# Patient Record
Sex: Female | Born: 1980
Health system: Southern US, Community
[De-identification: ages and names within clinical notes are randomized; demographics above are authoritative.]

## PROBLEM LIST (undated history)

## (undated) DIAGNOSIS — F32A Depression, unspecified: Secondary | ICD-10-CM

## (undated) DIAGNOSIS — K219 Gastro-esophageal reflux disease without esophagitis: Secondary | ICD-10-CM

## (undated) DIAGNOSIS — F419 Anxiety disorder, unspecified: Secondary | ICD-10-CM

## (undated) HISTORY — PX: EYE SURGERY: SHX253

## (undated) HISTORY — DX: Depression, unspecified: F32.A

---

## 2011-08-21 DIAGNOSIS — E559 Vitamin D deficiency, unspecified: Secondary | ICD-10-CM | POA: Insufficient documentation

## 2012-03-05 DIAGNOSIS — E221 Hyperprolactinemia: Secondary | ICD-10-CM | POA: Insufficient documentation

## 2013-09-20 DIAGNOSIS — O1490 Unspecified pre-eclampsia, unspecified trimester: Secondary | ICD-10-CM | POA: Insufficient documentation

## 2016-01-29 DIAGNOSIS — Z975 Presence of (intrauterine) contraceptive device: Secondary | ICD-10-CM | POA: Insufficient documentation

## 2019-12-13 DIAGNOSIS — Z01419 Encounter for gynecological examination (general) (routine) without abnormal findings: Secondary | ICD-10-CM | POA: Diagnosis not present

## 2019-12-13 DIAGNOSIS — Z6828 Body mass index (BMI) 28.0-28.9, adult: Secondary | ICD-10-CM | POA: Diagnosis not present

## 2020-02-06 LAB — HM PAP SMEAR: HM Pap smear: NORMAL

## 2020-03-02 DIAGNOSIS — Z30433 Encounter for removal and reinsertion of intrauterine contraceptive device: Secondary | ICD-10-CM | POA: Diagnosis not present

## 2020-04-10 DIAGNOSIS — Z30431 Encounter for routine checking of intrauterine contraceptive device: Secondary | ICD-10-CM | POA: Diagnosis not present

## 2020-12-20 DIAGNOSIS — Z124 Encounter for screening for malignant neoplasm of cervix: Secondary | ICD-10-CM | POA: Diagnosis not present

## 2020-12-20 DIAGNOSIS — Z6829 Body mass index (BMI) 29.0-29.9, adult: Secondary | ICD-10-CM | POA: Diagnosis not present

## 2020-12-20 DIAGNOSIS — Z01419 Encounter for gynecological examination (general) (routine) without abnormal findings: Secondary | ICD-10-CM | POA: Diagnosis not present

## 2021-01-03 DIAGNOSIS — Z20822 Contact with and (suspected) exposure to covid-19: Secondary | ICD-10-CM | POA: Diagnosis not present

## 2021-01-17 ENCOUNTER — Emergency Department
Admission: RE | Admit: 2021-01-17 | Discharge: 2021-01-17 | Disposition: A | Discharge status: Home / Self Care | Payer: Self-pay | Source: Ambulatory Visit | Attending: Family Medicine | Admitting: Family Medicine

## 2021-01-17 ENCOUNTER — Emergency Department (INDEPENDENT_AMBULATORY_CARE_PROVIDER_SITE_OTHER): Payer: BC Managed Care – PPO

## 2021-01-17 ENCOUNTER — Other Ambulatory Visit: Payer: Self-pay

## 2021-01-17 VITALS — BP 118/82 | HR 81 | Temp 98.8°F | Resp 16 | Ht 64.0 in | Wt 173.0 lb

## 2021-01-17 DIAGNOSIS — R079 Chest pain, unspecified: Secondary | ICD-10-CM | POA: Diagnosis not present

## 2021-01-17 DIAGNOSIS — R1013 Epigastric pain: Secondary | ICD-10-CM

## 2021-01-17 DIAGNOSIS — R141 Gas pain: Secondary | ICD-10-CM

## 2021-01-17 HISTORY — DX: Gastro-esophageal reflux disease without esophagitis: K21.9

## 2021-01-17 HISTORY — DX: Anxiety disorder, unspecified: F41.9

## 2021-01-17 MED ORDER — OMEPRAZOLE 20 MG PO CPDR: 20.0000 mg | DELAYED_RELEASE_CAPSULE | Freq: Every day | ORAL | 0 refills | Status: AC

## 2021-01-17 MED ORDER — ALUM & MAG HYDROXIDE-SIMETH 200-200-20 MG/5ML PO SUSP
30.0000 mL | Freq: Once | ORAL | Status: AC
  Administered 2021-01-17: 30 mL via ORAL

## 2021-01-17 MED ORDER — LIDOCAINE VISCOUS HCL 2 % MT SOLN
15.0000 mL | Freq: Once | OROMUCOSAL | Status: AC
  Administered 2021-01-17: 15 mL via OROMUCOSAL

## 2021-01-17 NOTE — Discharge Instructions (Addendum)
Take the Prilosec once a day for 2 weeks.  After that she may take it as needed Consider Mylanta (with simethicone) instead of Tums when you have abdominal pain Consider getting some Gas-X to take Watch your diet.  Avoid highly spiced and fatty foods until you feel better Follow-up with your primary care doctor

## 2021-01-17 NOTE — ED Triage Notes (Addendum)
Patient c/o intermittent central CP x 3-4 days.  Described as tight and wraps around to her upper back. No other associated symptoms.  It started while on vacation. Reports she developed congestion then the CP started the next day. H/o heartburn and reflux. Usually tums alleviates CP, tums is not helping. H/o MI with her grandmother @ 78. She does not have a PCP.

## 2021-01-17 NOTE — ED Provider Notes (Signed)
[facility]    CSN: [id number] Arrival date & time: 01/17/21  0948      History   Chief Complaint Chief Complaint  Patient presents with   Chest Pain    HPI Brenda Jackson is a 40 y.o. female.   HPI Patient is a healthy 40 year old woman.  Only medication is multiple vitamins.  She walks daily up to 8 miles a day.  She does not have any known health conditions. She was seen in the emergency room in January 2020 for precordial pain.  Her work-up included blood work, troponins, chest x-ray, and EKG.  The EKG did have an irregular rhythm and she was advised to see cardiology.  This message somehow was not forwarded to the patient and she has not seen anybody since that time for follow-up. Today she is here for chest pain.  It comes intermittently.  It is not related to exercise.  She has continued to walk without difficulty.  It started while she was on vacation.  They went on a [place] cruise.  She states that the pain is in her central chest.  Upper abdomen.  Radiates around to her back.  She has been taking Tums with no improvement.  No nausea or change in appetite. Patient has no cardiac risk factors, she did have 1 grandmother who had a heart attack at age 9.  Patient has no hypertension, diabetes, hyperlipidemia, cigarette smoking or known risk. Past Medical History:  Diagnosis Date   Acid reflux    Anxiety     There are no problems to display for this patient.   Past Surgical History:  Procedure Laterality Date   EYE SURGERY      OB History   No obstetric history on file.      Home Medications    Prior to Admission medications   Medication Sig Start Date End Date Taking? Authorizing Provider  Multiple Vitamin (MULTIVITAMIN) tablet Take 1 tablet by mouth daily.   Yes [provider]  omeprazole (PRILOSEC) 20 MG capsule Take 1 capsule (20 mg total) by mouth daily. 01/17/21  Yes Eustace Moore, MD    Family History Family History   Problem Relation Age of Onset   Heart attack Maternal Grandmother     Social History Social History   Tobacco Use   Smoking status: Never   Smokeless tobacco: Never  Vaping Use   Vaping Use: Never used  Substance Use Topics   Alcohol use: Yes   Drug use: Never     Allergies   Patient has no known allergies.   Review of Systems Review of Systems See HPI  Physical Exam Triage Vital Signs ED Triage Vitals  Enc Vitals Group     BP 01/17/21 1015 118/82     Pulse Rate 01/17/21 1015 81     Resp 01/17/21 1015 16     Temp 01/17/21 1015 98.8 F (37.1 C)     Temp Source 01/17/21 1015 Oral     SpO2 01/17/21 1015 99 %     Weight 01/17/21 1021 173 lb (78.5 kg)     Height 01/17/21 1021 5\' 4"  (1.626 m)     Head Circumference --      Peak Flow --      Pain Score 01/17/21 1021 0     Pain Loc --      Pain Edu? --      Excl. in GC? --    No data found.  Updated Vital  Signs BP 118/82 (BP Location: Right Arm)   Pulse 81   Temp 98.8 F (37.1 C) (Oral)   Resp 16   Ht 5\' 4"  (1.626 m)   Wt 78.5 kg   SpO2 99%   BMI 29.70 kg/m       Physical Exam Constitutional:      General: She is not in acute distress.    Appearance: Normal appearance. She is well-developed and normal weight.  HENT:     Head: Normocephalic and atraumatic.     Right Ear: Tympanic membrane and ear canal normal.     Left Ear: Tympanic membrane and ear canal normal.     Nose: Nose normal. No congestion.     Mouth/Throat:     Mouth: Mucous membranes are moist.     Pharynx: No posterior oropharyngeal erythema.  Eyes:     Conjunctiva/sclera: Conjunctivae normal.     Pupils: Pupils are equal, round, and reactive to light.  Cardiovascular:     Rate and Rhythm: Normal rate and regular rhythm.     Heart sounds: Normal heart sounds.  Pulmonary:     Effort: Pulmonary effort is normal. No respiratory distress.     Breath sounds: Normal breath sounds.  Abdominal:     General: Abdomen is flat. Bowel  sounds are normal. There is no distension.     Palpations: Abdomen is soft.     Tenderness: There is abdominal tenderness.     Comments: Midepigastric tenderness to deep palpation.  No organomegaly.  Musculoskeletal:        General: Normal range of motion.     Cervical back: Normal range of motion and neck supple.     Right lower leg: No edema.     Left lower leg: No edema.  Skin:    General: Skin is warm and dry.  Neurological:     General: No focal deficit present.     Mental Status: She is alert.  Psychiatric:        Mood and Affect: Mood normal.        Behavior: Behavior normal.     UC Treatments / Results  Labs (all labs ordered are listed, but only abnormal results are displayed) Labs Reviewed - No data to display  EKG-rate is 72.  Normal sinus rhythm.  No ST or T wave changes. Prior EKG is not available for review.   Radiology DG Chest 2 View  Result Date: 01/17/2021 CLINICAL DATA:  Chest pain EXAM: CHEST - 2 VIEW COMPARISON:  None. FINDINGS: The heart size and mediastinal contours are within normal limits. Both lungs are clear. The visualized skeletal structures are unremarkable. IMPRESSION: Normal study. Electronically Signed   By: 01/19/2021 M.D.   On: 01/17/2021 11:19    Procedures Procedures (including critical care time)  Medications Ordered in UC Medications  alum & mag hydroxide-simeth (MAALOX/MYLANTA) 200-200-20 MG/5ML suspension 30 mL (30 mLs Oral Given 01/17/21 1157)  lidocaine (XYLOCAINE) 2 % viscous mouth solution 15 mL (15 mLs Mouth/Throat Given 01/17/21 1157)    Initial Impression / Assessment and Plan / UC Course  I have reviewed the triage vital signs and the nursing notes.  Pertinent labs & imaging results that were available during my care of the patient were reviewed by me and considered in my medical decision making (see chart for details).     PatientDeveloped chest pain during the office visit.  It was similar to her chest pain  complaint.  She was given a  GI cocktail and had almost immediate relief of her symptoms. EKG and chest x-ray are normal.  No evidence of cardiac disease. Final Clinical Impressions(s) / UC Diagnoses   Final diagnoses:  Abdominal pain, epigastric  Abdominal gas pain     Discharge Instructions      Take the Prilosec once a day for 2 weeks.  After that she may take it as needed Consider Mylanta (with simethicone) instead of Tums when you have abdominal pain Consider getting some Gas-X to take Watch your diet.  Avoid highly spiced and fatty foods until you feel better Follow-up with your primary care doctor     ED Prescriptions     Medication Sig Dispense Auth. Provider   omeprazole (PRILOSEC) 20 MG capsule Take 1 capsule (20 mg total) by mouth daily. 30 capsule Eustace Moore, MD      PDMP not reviewed this encounter.   Eustace Moore, MD 01/17/21 1300

## 2021-01-18 DIAGNOSIS — Z30431 Encounter for routine checking of intrauterine contraceptive device: Secondary | ICD-10-CM | POA: Diagnosis not present

## 2021-01-25 DIAGNOSIS — Z1231 Encounter for screening mammogram for malignant neoplasm of breast: Secondary | ICD-10-CM | POA: Diagnosis not present

## 2021-03-20 DIAGNOSIS — L814 Other melanin hyperpigmentation: Secondary | ICD-10-CM | POA: Diagnosis not present

## 2021-03-20 DIAGNOSIS — L700 Acne vulgaris: Secondary | ICD-10-CM | POA: Diagnosis not present

## 2021-03-26 ENCOUNTER — Other Ambulatory Visit: Payer: Self-pay

## 2021-03-26 ENCOUNTER — Ambulatory Visit (INDEPENDENT_AMBULATORY_CARE_PROVIDER_SITE_OTHER): Payer: BC Managed Care – PPO | Admitting: Medical-Surgical

## 2021-03-26 ENCOUNTER — Encounter: Payer: Self-pay | Admitting: Medical-Surgical

## 2021-03-26 VITALS — BP 124/85 | HR 68 | Resp 20 | Ht 64.0 in | Wt 180.0 lb

## 2021-03-26 DIAGNOSIS — E661 Drug-induced obesity: Secondary | ICD-10-CM

## 2021-03-26 DIAGNOSIS — Z6830 Body mass index (BMI) 30.0-30.9, adult: Secondary | ICD-10-CM

## 2021-03-26 DIAGNOSIS — K219 Gastro-esophageal reflux disease without esophagitis: Secondary | ICD-10-CM

## 2021-03-26 DIAGNOSIS — Z131 Encounter for screening for diabetes mellitus: Secondary | ICD-10-CM

## 2021-03-26 DIAGNOSIS — Z0000 Encounter for general adult medical examination without abnormal findings: Secondary | ICD-10-CM

## 2021-03-26 DIAGNOSIS — Z1329 Encounter for screening for other suspected endocrine disorder: Secondary | ICD-10-CM | POA: Diagnosis not present

## 2021-03-26 DIAGNOSIS — E66811 Obesity, class 1: Secondary | ICD-10-CM

## 2021-03-26 DIAGNOSIS — F419 Anxiety disorder, unspecified: Secondary | ICD-10-CM | POA: Diagnosis not present

## 2021-03-26 DIAGNOSIS — Z7689 Persons encountering health services in other specified circumstances: Secondary | ICD-10-CM

## 2021-03-26 NOTE — Progress Notes (Signed)
New Patient Office Visit  Subjective:  Patient ID: Brenda Jackson, female    DOB: 1980/11/11  Age: 40 y.o. MRN: [id number]  CC:  Chief Complaint  Patient presents with   Establish Care    HPI [name] presents to establish care.  Reflux-recently seen in the urgent care for epigastric/chest pain.  Was given GI cocktail which provided almost immediate relief.  Started on omeprazole daily and daily as needed.  Notes that the medication has worked well and she rarely takes at home.  Instead, she uses Gas-X if she is eating foods that she knows causes excessive gassiness.  She also uses Mylanta as needed which is more helpful than Pepto-Bismol.  Dentist: Has yearly exams, no concerns today Eye exam-had LASEK in March and has been undergoing testing for that, no concerns today Exercise-walks 3 to 4 days/week for approximately 2 to 5 miles, no weightbearing exercises Diet-could be better, tends to be a stress eater Pap smear completed last summer, normal  Past Medical History:  Diagnosis Date   Acid reflux    Anxiety    Depression 2007    Past Surgical History:  Procedure Laterality Date   EYE SURGERY      Family History  Problem Relation Age of Onset   Heart attack Maternal Grandmother    Arthritis Maternal Grandmother    Heart disease Maternal Grandmother    Anxiety disorder Mother    Depression Mother    Varicose Veins Maternal Aunt     Social History   Socioeconomic History   Marital status: Married    Spouse name: Not on file   Number of children: Not on file   Years of education: Not on file   Highest education level: Not on file  Occupational History   Not on file  Tobacco Use   Smoking status: Never   Smokeless tobacco: Never  Vaping Use   Vaping Use: Never used  Substance and Sexual Activity   Alcohol use: Yes    Comment: Drink socially, maybe ince per month   Drug use: Not Currently   Sexual activity: Yes    Birth control/protection: I.U.D.   Other Topics Concern   Not on file  Social History Narrative   Not on file   Social Determinants of Health   Financial Resource Strain: Not on file  Food Insecurity: Not on file  Transportation Needs: Not on file  Physical Activity: Not on file  Stress: Not on file  Social Connections: Not on file  Intimate Partner Violence: Not on file    ROS Review of Systems  Constitutional:  Negative for chills, fatigue, fever and unexpected weight change.  HENT:  Negative for congestion, rhinorrhea, sinus pressure and sore throat.   Respiratory:  Negative for cough, chest tightness and shortness of breath.   Cardiovascular:  Positive for chest pain (reflux). Negative for palpitations and leg swelling.  Gastrointestinal:  Positive for abdominal pain (reflux). Negative for constipation, diarrhea, nausea and vomiting.  Endocrine: Positive for heat intolerance. Negative for cold intolerance.  Genitourinary:  Negative for dysuria, frequency, urgency, vaginal bleeding and vaginal discharge.  Musculoskeletal:  Positive for arthralgias, back pain and myalgias.  Skin:  Negative for rash and wound.  Neurological:  Positive for headaches. Negative for dizziness and light-headedness.  Hematological:  Does not bruise/bleed easily.  Psychiatric/Behavioral:  Positive for dysphoric mood. Negative for self-injury, sleep disturbance and suicidal ideas. The patient is nervous/anxious.    Objective:   Today's Vitals: BP 124/85 (  BP Location: Left Arm, Patient Position: Sitting, Cuff Size: Normal)   Pulse 68   Resp 20   Ht 5\' 4"  (1.626 m)   Wt 180 lb (81.6 kg)   SpO2 99%   BMI 30.90 kg/m   Physical Exam Constitutional:      General: She is not in acute distress.    Appearance: Normal appearance. She is not ill-appearing.  HENT:     Head: Normocephalic and atraumatic.     Right Ear: Tympanic membrane normal.     Left Ear: Tympanic membrane normal.  Eyes:     General: No scleral icterus.        Right eye: No discharge.        Left eye: No discharge.     Extraocular Movements: Extraocular movements intact.     Conjunctiva/sclera: Conjunctivae normal.     Pupils: Pupils are equal, round, and reactive to light.  Neck:     Thyroid: No thyromegaly.     Vascular: No carotid bruit or JVD.     Trachea: Trachea normal.  Cardiovascular:     Rate and Rhythm: Normal rate and regular rhythm.     Pulses: Normal pulses.     Heart sounds: Normal heart sounds. No murmur heard.   No friction rub. No gallop.  Pulmonary:     Effort: Pulmonary effort is normal. No respiratory distress.     Breath sounds: Normal breath sounds. No wheezing.  Abdominal:     General: Bowel sounds are normal. There is no distension.     Palpations: Abdomen is soft.     Tenderness: There is no abdominal tenderness. There is no guarding.  Musculoskeletal:        General: Normal range of motion.     Cervical back: Normal range of motion and neck supple.  Skin:    General: Skin is warm and dry.  Neurological:     Mental Status: She is alert and oriented to person, place, and time.     Cranial Nerves: No cranial nerve deficit.  Psychiatric:        Mood and Affect: Mood normal.        Behavior: Behavior normal.        Thought Content: Thought content normal.        Judgment: Judgment normal.    Assessment & Plan:   1. Encounter to establish care Reviewed available information and discussed care concerns with patient.   2. Gastroesophageal reflux disease without esophagitis Continue omeprazole, Mylanta, Gas-X as needed.  3. Anxiety Not currently taking medication.  Symptoms managed with lifestyle modifications.  4. Class 1 drug-induced obesity with serious comorbidity and body mass index (BMI) of 30.0 to 30.9 in adult Checking TSH and hemoglobin A1c.  Recommend increasing intentional exercise and incorporating body weight exercises.  Also recommend monitoring dietary intake and making sure to pay attention  to getting enough protein. - TSH - Hemoglobin A1c  5. Annual physical exam Checking CBC with differential, CMP, lipid panel today. - CBC with Differential/Platelet - COMPLETE METABOLIC PANEL WITH GFR - Lipid panel  6. Thyroid disorder screen Checking TSH. - TSH  7. Diabetes mellitus screening Checking hemoglobin A1c. - Hemoglobin A1c   Outpatient Encounter Medications as of 03/26/2021  Medication Sig   Clindamycin-Benzoyl Per, Refr, gel Apply topically daily.   doxycycline (VIBRA-TABS) 100 MG tablet Take 100 mg by mouth daily.   ibuprofen (ADVIL) 200 MG tablet Take by mouth.   Multiple Vitamin (MULTIVITAMIN) tablet Take 1  tablet by mouth daily.   omeprazole (PRILOSEC) 20 MG capsule Take 1 capsule (20 mg total) by mouth daily.   No facility-administered encounter medications on file as of 03/26/2021.    Follow-up: Return in about 1 year (around 03/26/2022) for annual physical exam or sooner if needed.   Thayer Ohm, DNP, APRN, FNP-BC [facility]

## 2021-03-26 NOTE — Patient Instructions (Signed)
Preventive Care 40-41 Years Old, Female Preventive care refers to lifestyle choices and visits with your health care provider that can promote health and wellness. This includes: A yearly physical exam. This is also called an annual wellness visit. Regular dental and eye exams. Immunizations. Screening for certain conditions. Healthy lifestyle choices, such as: Eating a healthy diet. Getting regular exercise. Not using drugs or products that contain nicotine and tobacco. Limiting alcohol use. What can I expect for my preventive care visit? Physical exam Your health care provider will check your: Height and weight. These may be used to calculate your BMI (body mass index). BMI is a measurement that tells if you are at a healthy weight. Heart rate and blood pressure. Body temperature. Skin for abnormal spots. Counseling Your health care provider may ask you questions about your: Past medical problems. Family's medical history. Alcohol, tobacco, and drug use. Emotional well-being. Home life and relationship well-being. Sexual activity. Diet, exercise, and sleep habits. Work and work environment. Access to firearms. Method of birth control. Menstrual cycle. Pregnancy history. What immunizations do I need? Vaccines are usually given at various ages, according to a schedule. Your health care provider will recommend vaccines for you based on your age, medical history, and lifestyle or other factors, such as travel or where you work. What tests do I need? Blood tests Lipid and cholesterol levels. These may be checked every 5 years, or more often if you are over 50 years old. Hepatitis C test. Hepatitis B test. Screening Lung cancer screening. You may have this screening every year starting at age 55 if you have a 30-pack-year history of smoking and currently smoke or have quit within the past 15 years. Colorectal cancer screening. All adults should have this screening starting at  age 50 and continuing until age 75. Your health care provider may recommend screening at age 45 if you are at increased risk. You will have tests every 1-10 years, depending on your results and the type of screening test. Diabetes screening. This is done by checking your blood sugar (glucose) after you have not eaten for a while (fasting). You may have this done every 1-3 years. Mammogram. This may be done every 1-2 years. Talk with your health care provider about when you should start having regular mammograms. This may depend on whether you have a family history of breast cancer. BRCA-related cancer screening. This may be done if you have a family history of breast, ovarian, tubal, or peritoneal cancers. Pelvic exam and Pap test. This may be done every 3 years starting at age 21. Starting at age 30, this may be done every 5 years if you have a Pap test in combination with an HPV test. Other tests STD (sexually transmitted disease) testing, if you are at risk. Bone density scan. This is done to screen for osteoporosis. You may have this scan if you are at high risk for osteoporosis. Talk with your health care provider about your test results, treatment options, and if necessary, the need for more tests. Follow these instructions at home: Eating and drinking  Eat a diet that includes fresh fruits and vegetables, whole grains, lean protein, and low-fat dairy products. Take vitamin and mineral supplements as recommended by your health care provider. Do not drink alcohol if: Your health care provider tells you not to drink. You are pregnant, may be pregnant, or are planning to become pregnant. If you drink alcohol: Limit how much you have to 0-1 drink a day. Be   aware of how much alcohol is in your drink. In the [place] one drink equals one 12 oz bottle of beer (355 mL), one 5 oz glass of wine (148 mL), or one 1 oz glass of hard liquor (44 mL). Lifestyle Take daily care of your teeth and  gums. Brush your teeth every morning and night with fluoride toothpaste. Floss one time each day. Stay active. Exercise for at least 30 minutes 5 or more days each week. Do not use any products that contain nicotine or tobacco, such as cigarettes, e-cigarettes, and chewing tobacco. If you need help quitting, ask your health care provider. Do not use drugs. If you are sexually active, practice safe sex. Use a condom or other form of protection to prevent STIs (sexually transmitted infections). If you do not wish to become pregnant, use a form of birth control. If you plan to become pregnant, see your health care provider for a prepregnancy visit. If told by your health care provider, take low-dose aspirin daily starting at age 63. Find healthy ways to cope with stress, such as: Meditation, yoga, or listening to music. Journaling. Talking to a trusted person. Spending time with friends and family. Safety Always wear your seat belt while driving or riding in a vehicle. Do not drive: If you have been drinking alcohol. Do not ride with someone who has been drinking. When you are tired or distracted. While texting. Wear a helmet and other protective equipment during sports activities. If you have firearms in your house, make sure you follow all gun safety procedures. What's next? Visit your health care provider once a year for an annual wellness visit. Ask your health care provider how often you should have your eyes and teeth checked. Stay up to date on all vaccines. This information is not intended to replace advice given to you by your health care provider. Make sure you discuss any questions you have with your health care provider. Document Revised: 09/01/2020 Document Reviewed: 03/05/2018 Elsevier Patient Education  2022 [employer].

## 2021-03-27 LAB — HEMOGLOBIN A1C
Hgb A1c MFr Bld: 5 % of total Hgb (ref <5.7)
Mean Plasma Glucose: 97 mg/dL
eAG (mmol/L): 5.4 mmol/L

## 2021-03-27 LAB — COMPLETE METABOLIC PANEL WITH GFR
AG Ratio: 2 (calc) (ref 1.0–2.5)
ALT: 11 U/L (ref 6–29)
AST: 16 U/L (ref 10–30)
Albumin: 4.8 g/dL (ref 3.6–5.1)
Alkaline phosphatase (APISO): 70 U/L (ref 31–125)
BUN: 14 mg/dL (ref 7–25)
CO2: 27 mmol/L (ref 20–32)
Calcium: 9.8 mg/dL (ref 8.6–10.2)
Chloride: 103 mmol/L (ref 98–110)
Creat: 0.71 mg/dL (ref 0.50–0.99)
Globulin: 2.4 g/dL (calc) (ref 1.9–3.7)
Glucose, Bld: 79 mg/dL (ref 65–99)
Potassium: 4.4 mmol/L (ref 3.5–5.3)
Sodium: 138 mmol/L (ref 135–146)
Total Bilirubin: 0.7 mg/dL (ref 0.2–1.2)
Total Protein: 7.2 g/dL (ref 6.1–8.1)
eGFR: 109 mL/min/{1.73_m2} (ref >=60)

## 2021-03-27 LAB — LIPID PANEL
Cholesterol: 198 mg/dL (ref <200)
HDL: 58 mg/dL (ref >=50)
LDL Cholesterol (Calc): 118 mg/dL (calc) — ABNORMAL HIGH
Non-HDL Cholesterol (Calc): 140 mg/dL (calc) — ABNORMAL HIGH (ref <130)
Total CHOL/HDL Ratio: 3.4 (calc) (ref <5.0)
Triglycerides: 114 mg/dL (ref <150)

## 2021-03-27 LAB — CBC WITH DIFFERENTIAL/PLATELET
Absolute Monocytes: 499 cells/uL (ref 200–950)
Basophils Absolute: 31 cells/uL (ref 0–200)
Basophils Relative: 0.4 %
Eosinophils Absolute: 133 cells/uL (ref 15–500)
Eosinophils Relative: 1.7 %
HCT: 43.3 % (ref 35.0–45.0)
Hemoglobin: 14.2 g/dL (ref 11.7–15.5)
Lymphs Abs: 2668 cells/uL (ref 850–3900)
MCH: 30.2 pg (ref 27.0–33.0)
MCHC: 32.8 g/dL (ref 32.0–36.0)
MCV: 92.1 fL (ref 80.0–100.0)
MPV: 11.2 fL (ref 7.5–12.5)
Monocytes Relative: 6.4 %
Neutro Abs: 4469 cells/uL (ref 1500–7800)
Neutrophils Relative %: 57.3 %
Platelets: 261 10*3/uL (ref 140–400)
RBC: 4.7 10*6/uL (ref 3.80–5.10)
RDW: 12.3 % (ref 11.0–15.0)
Total Lymphocyte: 34.2 %
WBC: 7.8 10*3/uL (ref 3.8–10.8)

## 2021-03-27 LAB — TSH: TSH: 1.01 mIU/L

## 2022-01-25 ENCOUNTER — Emergency Department
Admission: RE | Admit: 2022-01-25 | Discharge: 2022-01-25 | Disposition: A | Discharge status: Home / Self Care | Payer: BC Managed Care – PPO | Source: Ambulatory Visit | Attending: Nurse Practitioner | Admitting: Nurse Practitioner

## 2022-01-25 VITALS — BP 115/82 | HR 80 | Temp 99.2°F | Resp 14 | Ht 64.0 in | Wt 177.0 lb

## 2022-01-25 DIAGNOSIS — M79671 Pain in right foot: Secondary | ICD-10-CM | POA: Diagnosis not present

## 2022-01-25 DIAGNOSIS — M722 Plantar fascial fibromatosis: Secondary | ICD-10-CM | POA: Diagnosis not present

## 2022-01-25 MED ORDER — IBUPROFEN 800 MG PO TABS: 800.0000 mg | ORAL_TABLET | Freq: Three times a day (TID) | ORAL | 0 refills | Status: DC | PRN

## 2022-01-25 NOTE — ED Notes (Signed)
Pt walked to Dr Dionne Bucy office ([address]) to set up a follow up appointment

## 2022-01-25 NOTE — ED Triage Notes (Addendum)
Right foot pain x 2 years  Undiagnosed plantar fascitis Pain was unbearable the last few days  Pain worse at night Started wearing oofos yesterday  Constant pain 5 Wears compression sock  & does stretches OTC aleve, tylenol & ibuprofen

## 2022-01-25 NOTE — ED Provider Notes (Signed)
[facility]    CSN: [id number] Arrival date & time: 01/25/22  1751      History   Chief Complaint Chief Complaint  Patient presents with   Foot Pain    right    HPI Brenda Jackson is a 41 y.o. female.   Since with 2-year history of right heel pain, worsening over the past 2 months.  Reports the pain is severe after work when she takes her shoe off and also worse first thing in the morning.  She denies any recent fall, accident, injury, or trauma to the heel.  Denies radiation of pain up her leg.  Aggravating factors include weightbearing, walking/running.  She has tried ice, exercises, NSAIDs, rest without much relief.  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  She denies weakness with weightbearing, morning stiffness, swelling, redness, bruising, numbness or tingling in her toes, fevers, nausea/vomiting.    Past Medical History:  Diagnosis Date   Acid reflux    Anxiety    Depression 2007    Patient Active Problem List   Diagnosis Date Noted   IUD (intrauterine device) in place 01/29/2016   Pre-eclampsia 09/20/2013   Hyperprolactinemia (HCC) 03/05/2012   Vitamin D deficiency 08/21/2011    Past Surgical History:  Procedure Laterality Date   EYE SURGERY      OB History   No obstetric history on file.      Home Medications    Prior to Admission medications   Medication Sig Start Date End Date Taking? Authorizing Provider  ibuprofen (ADVIL) 800 MG tablet Take 1 tablet (800 mg total) by mouth every 8 (eight) hours as needed for moderate pain. Take with food to prevent GI upset 01/25/22  Yes Cathlean Marseilles A, NP  Clindamycin-Benzoyl Per, Refr, gel Apply topically daily. 03/20/21   [provider]  Multiple Vitamin (MULTIVITAMIN) tablet Take 1 tablet by mouth daily. Patient not taking: Reported on 01/25/2022    [provider]  omeprazole  (PRILOSEC) 20 MG capsule Take 1 capsule (20 mg total) by mouth daily. 01/17/21   Eustace Moore, MD    Family History Family History  Problem Relation Age of Onset   Anxiety disorder Mother    Depression Mother    Heart attack Maternal Grandmother    Arthritis Maternal Grandmother    Heart disease Maternal Grandmother    Varicose Veins Maternal Aunt     Social History Social History   Tobacco Use   Smoking status: Never   Smokeless tobacco: Never  Vaping Use   Vaping Use: Never used  Substance Use Topics   Alcohol use: Yes    Comment: Drink socially, maybe once per month   Drug use: Not Currently     Allergies   Patient has no known allergies.   Review of Systems Review of Systems Per HPI  Physical Exam Triage Vital Signs ED Triage Vitals  Enc Vitals Group     BP 01/25/22 1009 115/82     Pulse Rate 01/25/22 1009 80     Resp 01/25/22 1009 14     Temp 01/25/22 1009 99.2 F (37.3 C)     Temp Source 01/25/22 1009 Oral     SpO2 01/25/22 1009 99 %     Weight 01/25/22 1012 177 lb (80.3 kg)     Height 01/25/22 1012 5\' 4"  (1.626 m)  Head Circumference --      Peak Flow --      Pain Score 01/25/22 1011 5     Pain Loc --      Pain Edu? --      Excl. in GC? --    No data found.  Updated Vital Signs BP 115/82 (BP Location: Left Arm)   Pulse 80   Temp 99.2 F (37.3 C) (Oral)   Resp 14   Ht 5\' 4"  (1.626 m)   Wt 177 lb (80.3 kg)   SpO2 99%   BMI 30.38 kg/m   Visual Acuity Right Eye Distance:   Left Eye Distance:   Bilateral Distance:    Right Eye Near:   Left Eye Near:    Bilateral Near:     Physical Exam Vitals and nursing note reviewed.  Constitutional:      General: She is not in acute distress.    Appearance: Normal appearance. She is not toxic-appearing.  HENT:     Mouth/Throat:     Mouth: Mucous membranes are moist.     Pharynx: Oropharynx is clear.  Cardiovascular:     Pulses:          Dorsalis pedis pulses are 2+ on the right  side and 2+ on the left side.  Pulmonary:     Effort: Pulmonary effort is normal. No respiratory distress.  Musculoskeletal:       Feet:  Feet:     Comments: Inspection: No swelling, obvious deformity, or redness to bilateral lower extremities  Palpation: Tender to deep palpation in the area marked on the right heel; no obvious deformities palpated  ROM: Full ROM to ankle and flexibility of foot  Strength: 5/5 bilateral lower extremities Neurovascular: neurovascularly intact in left and right lower extremity  Skin:    General: Skin is warm and dry.     Capillary Refill: Capillary refill takes less than 2 seconds.     Coloration: Skin is not jaundiced or pale.     Findings: No erythema.  Neurological:     Mental Status: She is alert and oriented to person, place, and time.  Psychiatric:        Behavior: Behavior is cooperative.      UC Treatments / Results  Labs (all labs ordered are listed, but only abnormal results are displayed) Labs Reviewed - No data to display  EKG   Radiology No results found.  Procedures Procedures (including critical care time)  Medications Ordered in UC Medications - No data to display  Initial Impression / Assessment and Plan / UC Course  I have reviewed the triage vital signs and the nursing notes.  Pertinent labs & imaging results that were available during my care of the patient were reviewed by me and considered in my medical decision making (see chart for details).    Very pleasant, well-appearing 41 year old female presenting for right heel pain.  Highly suspicious for plantar fasciitis.  No red flags in history or on exam today.  Discussed treatment which she has already done much of on her own.  NSAID tablet sent to pharmacy.  Strongly encourage scheduling with a podiatrist, contact information given.  The patient was given the opportunity to ask questions.  All questions answered to their satisfaction.  The patient is in agreement  to this plan.   Final Clinical Impressions(s) / UC Diagnoses   Final diagnoses:  Pain of right heel  Plantar fasciitis of right foot     Discharge  Instructions      - Continue the great work with exercises, ice, and wearing supportive shoes - Can use Tylenol [phone] mg every 6 hours alternating with ibuprofen 800 mg every 8 hours as needed for pain - Follow up with a Podiatrist (contact information for [facility] office has been given, however Dr. Ralene Cork is in this building today) for further evaluation and management/treatment    ED Prescriptions     Medication Sig Dispense Auth. Provider   ibuprofen (ADVIL) 800 MG tablet Take 1 tablet (800 mg total) by mouth every 8 (eight) hours as needed for moderate pain. Take with food to prevent GI upset 21 tablet Valentino Nose, NP      PDMP not reviewed this encounter.   Valentino Nose, NP 01/25/22 1037

## 2022-01-25 NOTE — Discharge Instructions (Addendum)
-   Continue the great work with exercises, ice, and wearing supportive shoes - Can use Tylenol [phone] mg every 6 hours alternating with ibuprofen 800 mg every 8 hours as needed for pain - Follow up with a Podiatrist (contact information for [facility] office has been given, however Dr. Ralene Cork is in this building today) for further evaluation and management/treatment

## 2022-01-26 ENCOUNTER — Telehealth: Payer: Self-pay | Admitting: Emergency Medicine

## 2022-01-26 NOTE — Telephone Encounter (Signed)
Spoke with patient states she's doing better, she has a follow up with her podiatrist in August.  No further concerns.

## 2022-02-05 ENCOUNTER — Ambulatory Visit: Payer: BC Managed Care – PPO | Admitting: Podiatry

## 2022-02-05 ENCOUNTER — Ambulatory Visit (INDEPENDENT_AMBULATORY_CARE_PROVIDER_SITE_OTHER): Payer: BC Managed Care – PPO

## 2022-02-05 ENCOUNTER — Encounter: Payer: Self-pay | Admitting: Podiatry

## 2022-02-05 DIAGNOSIS — M722 Plantar fascial fibromatosis: Secondary | ICD-10-CM | POA: Diagnosis not present

## 2022-02-05 MED ORDER — DEXAMETHASONE SODIUM PHOSPHATE 120 MG/30ML IJ SOLN
4.0000 mg | Freq: Once | INTRAMUSCULAR | Status: AC
  Administered 2022-02-05: 4 mg via INTRA_ARTICULAR

## 2022-02-05 NOTE — Progress Notes (Signed)
  Subjective:  Patient ID: Brenda Jackson, female    DOB: 1980/11/11,   MRN: [id number]  Chief Complaint  Patient presents with   Plantar Fasciitis    Possible plantar fasciitis - patient went to urgent care 01/25/22 told her to f/u with [place]. Patient's family has a history of PF. Getting more constant day by day , level of pain varies but there is also some level of discomfort. When in pain resting foot helps , compression socks help , stretching and icing.     41 y.o. female presents for concern of right heel pain that has been going on for two years. Relates at first it was just in the mornings when walking on it. Relates now it is uncomfortable al the time. Has been stretching. Wears a compression socks. Has tried nice splint and ice. Wears oofos. Wears inserts. Tried OTC medications with minimal relief . Denies any other pedal complaints. Denies n/v/f/c.   Past Medical History:  Diagnosis Date   Acid reflux    Anxiety    Depression 2007    Objective:  Physical Exam: Vascular: DP/PT pulses 2/4 bilateral. CFT <3 seconds. Normal hair growth on digits. No edema.  Skin. No lacerations or abrasions bilateral feet.  Musculoskeletal: MMT 5/5 bilateral lower extremities in DF, PF, Inversion and Eversion. Deceased ROM in DF of ankle joint. Tender to medial calcaneal tubercle no pain along the arch. No pain with medial calcaneal squeeze. No pain to achilles or PT tendon.  Neurological: Sensation intact to light touch.   Assessment:   1. Plantar fasciitis of right foot      Plan:  Patient was evaluated and treated and all questions answered. Discussed plantar fasciitis with patient.  X-rays reviewed and discussed with patient. No acute fractures or dislocations noted. Mild spurring noted at inferior calcaneus.  Discussed treatment options including, ice, NSAIDS, supportive shoes, bracing, and stretching. Stretching exercises discussed.   Referral to PT  Patient requesting injection today.  Procedure note below.   Follow-up 8 weeks or sooner if any problems arise. In the meantime, encouraged to call the office with any questions, concerns, change in symptoms.   Procedure:  Discussed etiology, pathology, conservative vs. surgical therapies. At this time a plantar fascial injection was recommended.  The patient agreed and a sterile skin prep was applied.  An injection consisting of  dexamethasone and marcaine mixture was infiltrated at the point of maximal tenderness on the right Heel.  Bandaid applied. The patient tolerated this well and was given instructions for aftercare.    Louann Sjogren, DPM

## 2022-03-12 DIAGNOSIS — Z6829 Body mass index (BMI) 29.0-29.9, adult: Secondary | ICD-10-CM | POA: Diagnosis not present

## 2022-03-12 DIAGNOSIS — Z01419 Encounter for gynecological examination (general) (routine) without abnormal findings: Secondary | ICD-10-CM | POA: Diagnosis not present

## 2022-04-04 ENCOUNTER — Ambulatory Visit: Payer: BC Managed Care – PPO | Admitting: Podiatry

## 2022-04-05 ENCOUNTER — Ambulatory Visit: Payer: BC Managed Care – PPO | Admitting: Podiatry

## 2022-04-18 ENCOUNTER — Ambulatory Visit: Payer: BC Managed Care – PPO | Admitting: Podiatrist

## 2022-04-18 ENCOUNTER — Encounter: Payer: Self-pay | Admitting: Podiatrist

## 2022-04-18 DIAGNOSIS — M722 Plantar fascial fibromatosis: Secondary | ICD-10-CM

## 2022-04-18 MED ORDER — TRIAMCINOLONE ACETONIDE 10 MG/ML IJ SUSP
10.0000 mg | Freq: Once | INTRAMUSCULAR | Status: AC
  Administered 2022-04-18: 10 mg

## 2022-04-18 NOTE — Progress Notes (Signed)
Chief Complaint  Patient presents with   Plantar Fasciitis    Patient states PF is getter better she still feels the pain but it not like before       HPI: Patient is 41 y.o. female who presents today for follow up of plantar fasciitis of the right foot. She relates improvement with the injection and physical therapy- especially dry needling.  She has been wearing supportive oofos shoes and brooks shoes with inserts. Overall improved but still present.    No Known Allergies  Review of systems is negative except as noted in the HPI.  Denies nausea/ vomiting/ fevers/ chills or night sweats.   Denies difficulty breathing, denies calf pain or tenderness  Physical Exam  Patient is awake, alert, and oriented x 3.  In no acute distress.    Vascular status is intact with palpable pedal pulses DP and PT bilateral and capillary refill time less than 3 seconds bilateral.  No edema or erythema noted.   Neurological exam reveals epicritic and protective sensation grossly intact bilateral.   Dermatological exam reveals skin is supple and dry to bilateral feet.  No open lesions present.    Musculoskeletal exam: Musculature intact with dorsiflexion, plantarflexion, inversion, eversion. Ankle and First MPJ joint range of motion mildly decreased.    Continued pain on palpation plantar medial right foot is noted at the insertion of the plantar fascia medially.   Assessment:   ICD-10-CM   1. Plantar fasciitis of right foot  M72.2        Plan: Discussed treatment options and recommendations.  Discussed a second injection to try and relieve the residual pain she is experiencing.  She did agree and an injection was carried out to her right foot.  Recommended continued physical therapy.  If she needs an extension for PT she will call.  Otherwise she will call if the symptoms fail to resolve.      Procedure: Injection right foot Discussed alternatives, risks, complications and verbal consent was  obtained.  Location: plantar right heel Skin Prep: Alcohol. Injectate: 0.5cc 0.5% marcaine plain, 0.5 cc 10 mg kenalog Disposition: Patient tolerated procedure well. Injection site dressed with a band-aid.  Post-injection care was discussed and return precautions discussed.

## 2022-10-13 IMAGING — DX DG CHEST 2V
2 series · 2 of 2 positions shown · non-contrast
Comparison: None.

CLINICAL DATA: Chest pain

EXAM:
CHEST - 2 VIEW

[chest pa]
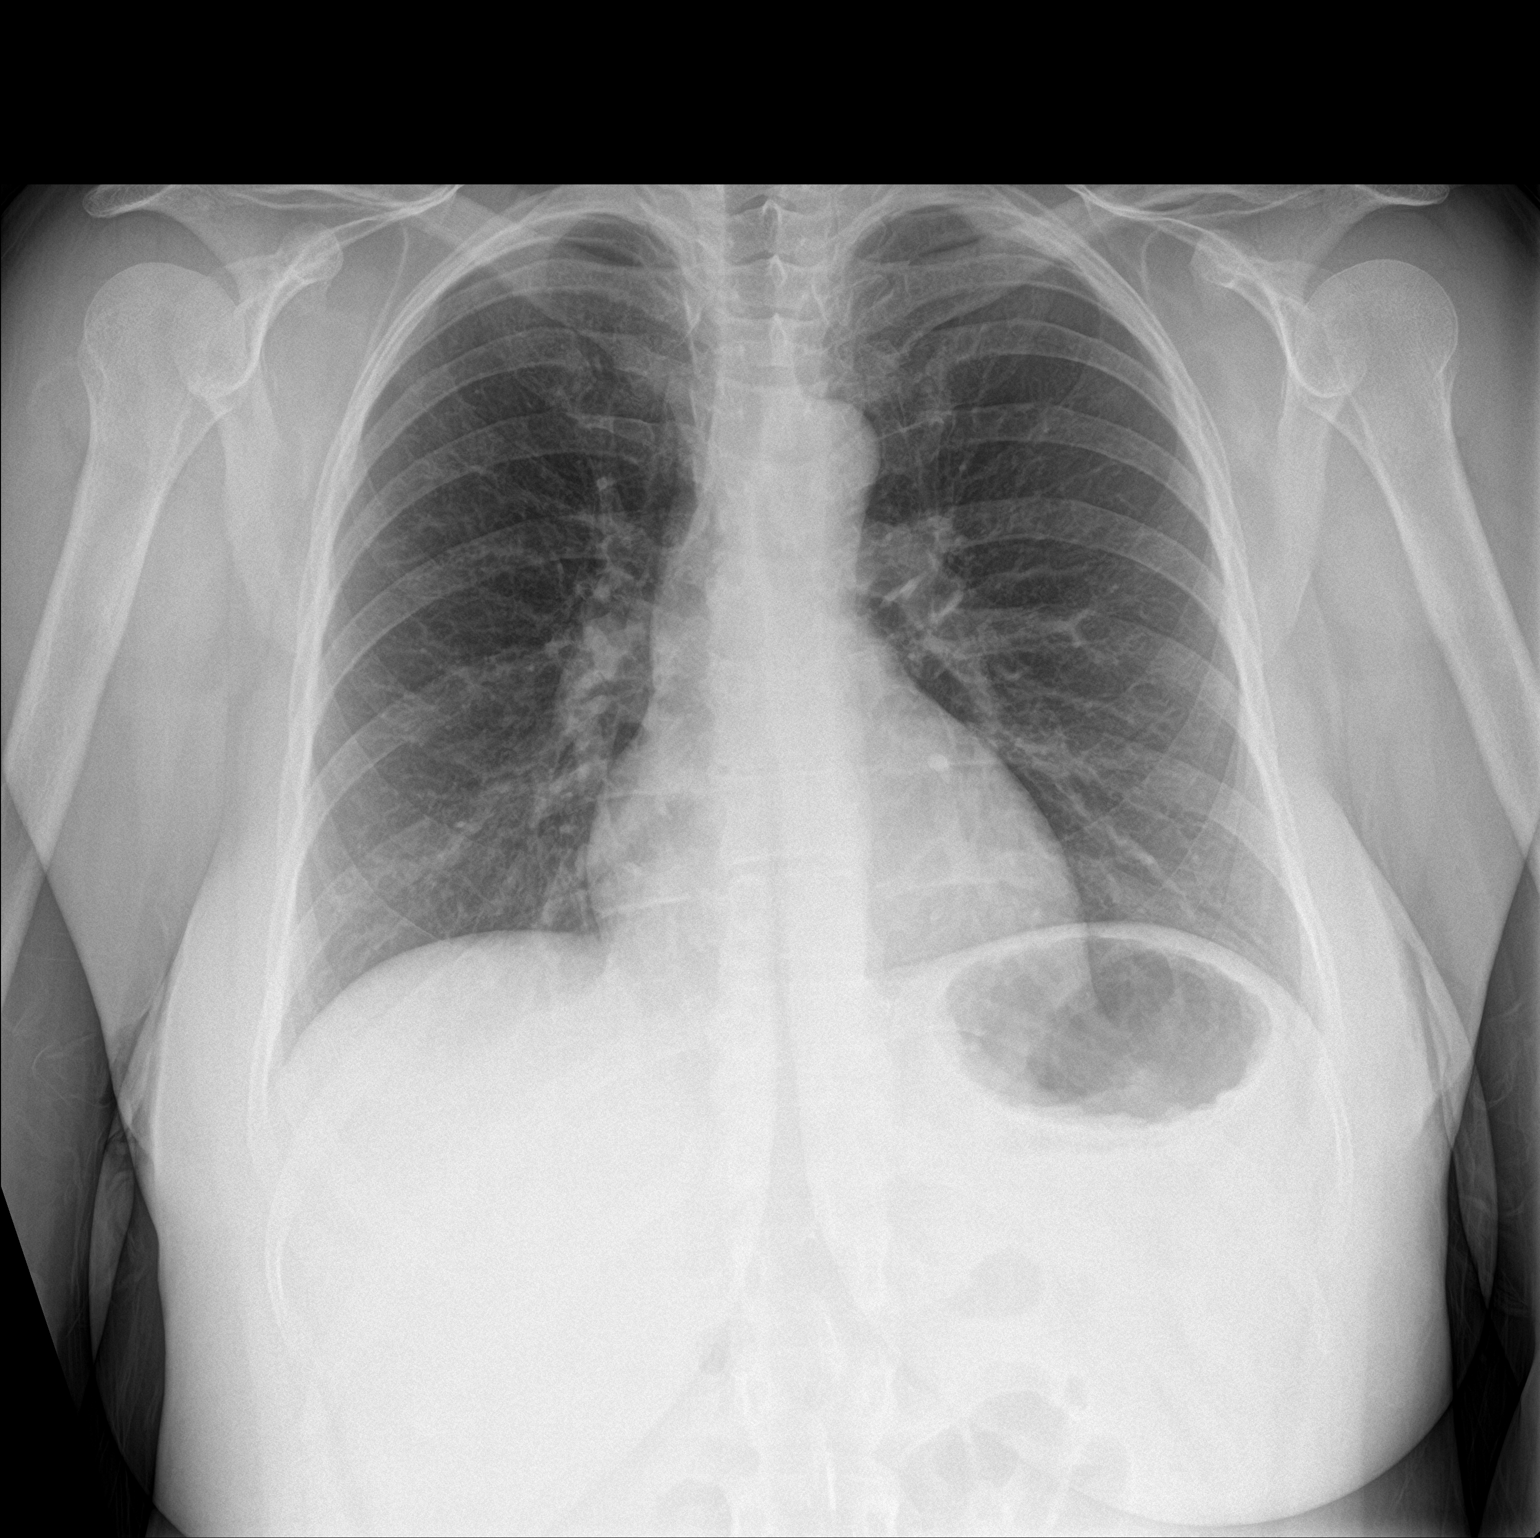

[chest lat]
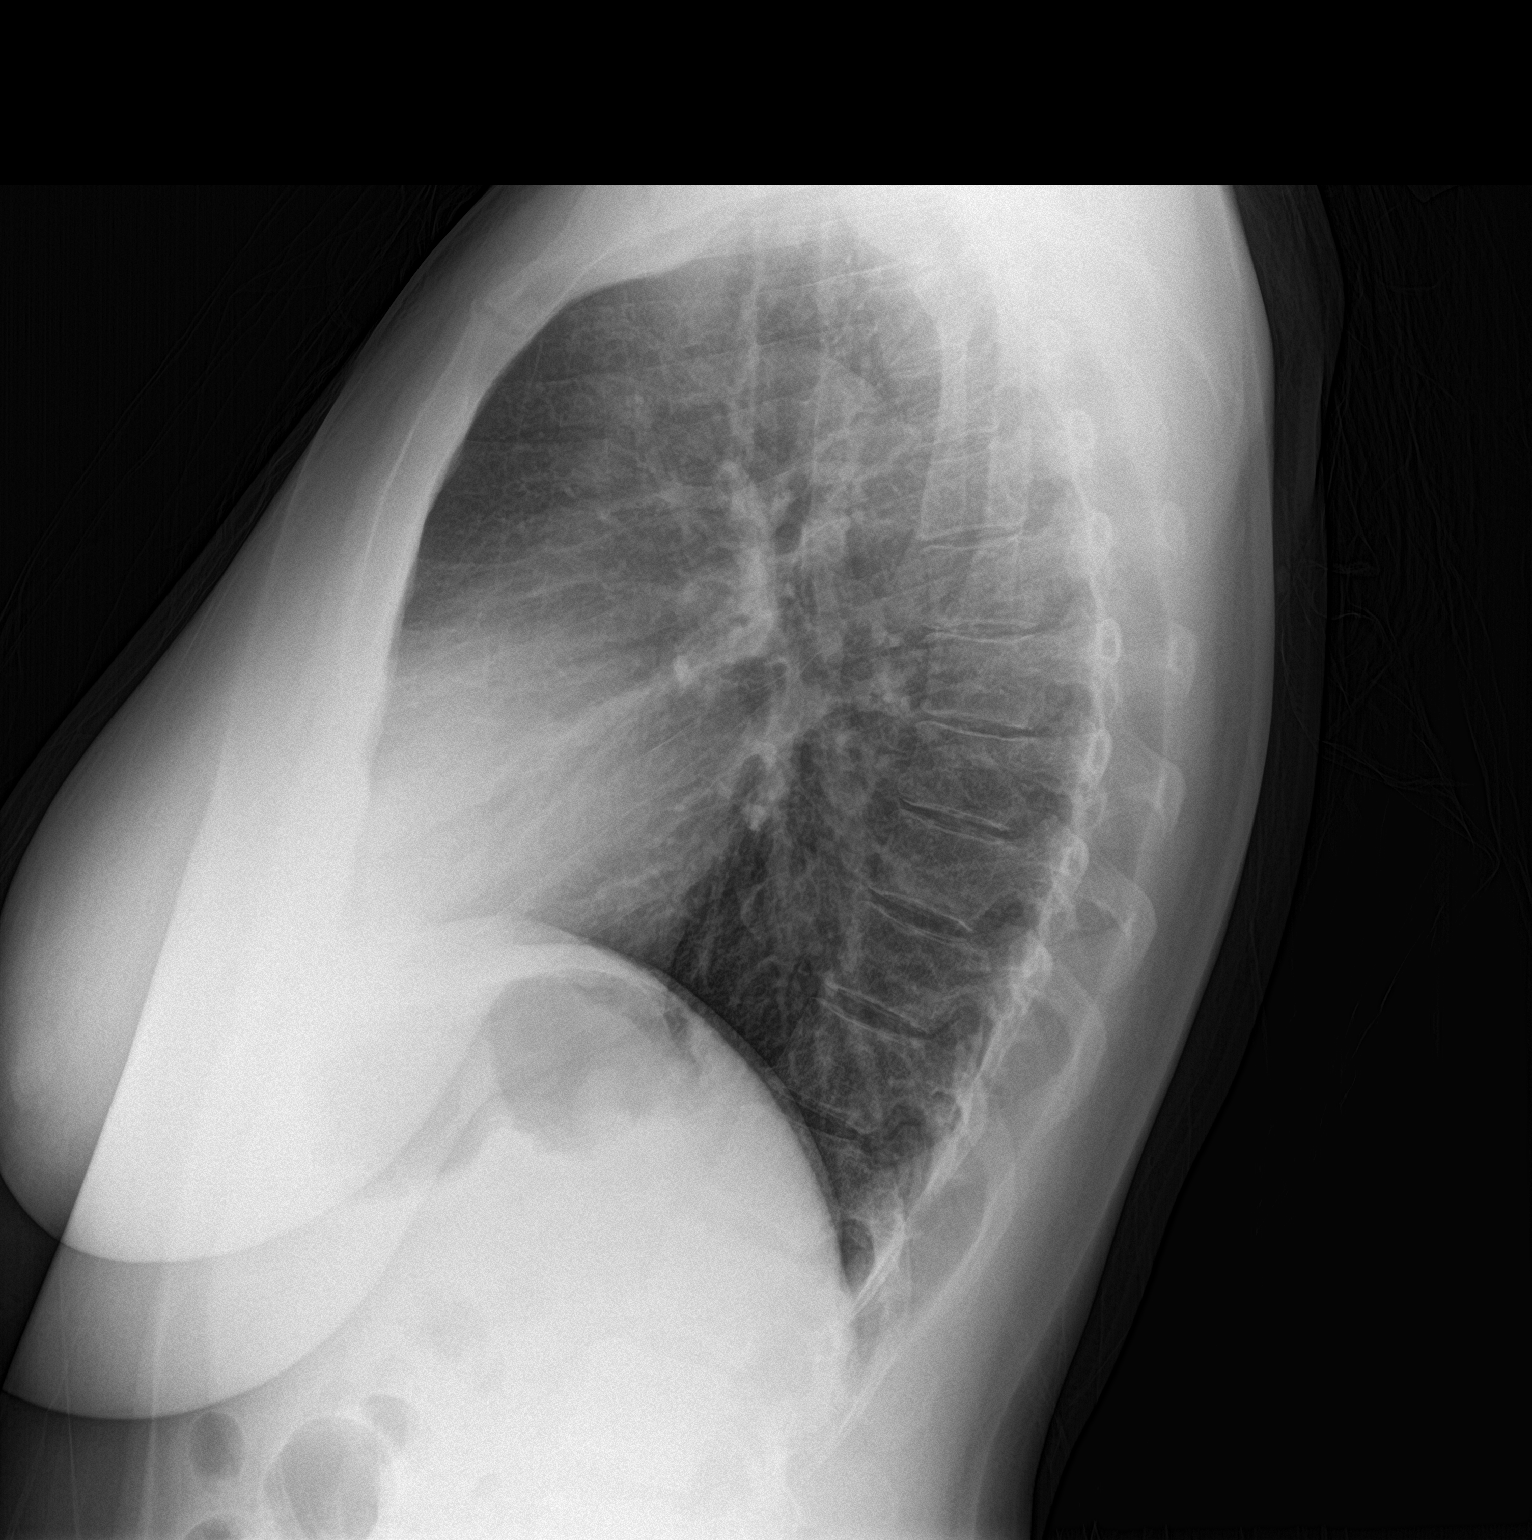

[2 of 2 positions shown; findings below may reference images not displayed]

FINDINGS: The heart size and mediastinal contours are within normal limits.
Both lungs are clear. The visualized skeletal structures are
unremarkable.
IMPRESSION: Normal study.

## 2023-09-25 ENCOUNTER — Ambulatory Visit
Admission: RE | Admit: 2023-09-25 | Discharge: 2023-09-25 | Disposition: A | Discharge status: Home / Self Care | Source: Ambulatory Visit | Attending: Family Medicine | Admitting: Family Medicine

## 2023-09-25 VITALS — BP 131/89 | HR 74 | Temp 98.2°F | Resp 18 | Ht 64.0 in | Wt 194.0 lb

## 2023-09-25 DIAGNOSIS — J0100 Acute maxillary sinusitis, unspecified: Secondary | ICD-10-CM | POA: Diagnosis not present

## 2023-09-25 MED ORDER — AMOXICILLIN-POT CLAVULANATE 875-125 MG PO TABS: 1.0000 | ORAL_TABLET | Freq: Two times a day (BID) | ORAL | 0 refills | Status: DC

## 2023-09-25 MED ORDER — PREDNISONE 50 MG PO TABS: ORAL_TABLET | ORAL | 0 refills | Status: DC

## 2023-09-25 MED ORDER — GUAIFENESIN ER 600 MG PO TB12: 600.0000 mg | ORAL_TABLET | Freq: Two times a day (BID) | ORAL | 0 refills | Status: AC

## 2023-09-25 NOTE — ED Provider Notes (Signed)
 [facility]    CSN: [id number] Arrival date & time: 09/25/23  1120      History   Chief Complaint Chief Complaint  Patient presents with   Nasal Congestion    Sinus pressure, cough, chest pain in cough - Entered by patient    HPI Brenda Jackson is a 43 y.o. female.   HPI  Patient states she has nasal congestion, sinus pressure, sinus drainage and postnasal drip that is been going on for a week.  She has wiping yellow mucus from emesis.  Also she states that nights the sinus drainage collects in the back of the throat and she feels like she will gag when she wakes up in the morning.  She has a cough and chest congestion.  Also coughing up yellow mucus.  Feels like the mucus is caught in her chest and she cannot expectorate.  She feels she is drinking enough water.  Her chest hurts when she coughs or takes deep breath.  The nighttime symptoms are making her feel tired during the day.  Past Medical History:  Diagnosis Date   Acid reflux    Anxiety    Depression 2007    Patient Active Problem List   Diagnosis Date Noted   IUD (intrauterine device) in place 01/29/2016   Pre-eclampsia 09/20/2013   Hyperprolactinemia (HCC) 03/05/2012   Vitamin D deficiency 08/21/2011    Past Surgical History:  Procedure Laterality Date   EYE SURGERY      OB History   No obstetric history on file.      Home Medications    Prior to Admission medications   Medication Sig Start Date End Date Taking? Authorizing Provider  amoxicillin-clavulanate (AUGMENTIN) 875-125 MG tablet Take 1 tablet by mouth every 12 (twelve) hours. 09/25/23  Yes Eustace Moore, MD  guaiFENesin (MUCINEX) 600 MG 12 hr tablet Take 1 tablet (600 mg total) by mouth 2 (two) times daily. 09/25/23  Yes Eustace Moore, MD  levonorgestrel (MIRENA) 20 MCG/DAY IUD 1 each by Intrauterine route once.   Yes [provider]  predniSONE (DELTASONE) 50 MG tablet Take once a day for 5 days.  Take with food  09/25/23  Yes Eustace Moore, MD  omeprazole (PRILOSEC) 20 MG capsule Take 1 capsule (20 mg total) by mouth daily. 01/17/21   Eustace Moore, MD    Family History Family History  Problem Relation Age of Onset   Anxiety disorder Mother    Depression Mother    Heart attack Maternal Grandmother    Arthritis Maternal Grandmother    Heart disease Maternal Grandmother    Varicose Veins Maternal Aunt     Social History Social History   Tobacco Use   Smoking status: Never   Smokeless tobacco: Never  Vaping Use   Vaping status: Never Used  Substance Use Topics   Alcohol use: Yes    Comment: Drink socially, maybe once per month   Drug use: Not Currently     Allergies   Patient has no known allergies.   Review of Systems Review of Systems See HPI  Physical Exam Triage Vital Signs ED Triage Vitals  Encounter Vitals Group     BP 09/25/23 1132 131/89     Systolic BP Percentile --      Diastolic BP Percentile --      Pulse Rate 09/25/23 1132 74     Resp 09/25/23 1132 18     Temp 09/25/23 1132 98.2 F (36.8 C)  Temp Source 09/25/23 1132 Oral     SpO2 09/25/23 1132 98 %     Weight 09/25/23 1136 194 lb (88 kg)     Height 09/25/23 1136 5\' 4"  (1.626 m)     Head Circumference --      Peak Flow --      Pain Score 09/25/23 1135 0     Pain Loc --      Pain Education --      Exclude from Growth Chart --    No data found.  Updated Vital Signs BP 131/89 (BP Location: Right Arm)   Pulse 74   Temp 98.2 F (36.8 C) (Oral)   Resp 18   Ht 5\' 4"  (1.626 m)   Wt 88 kg   SpO2 98%   BMI 33.30 kg/m      Physical Exam Constitutional:      General: She is not in acute distress.    Appearance: She is well-developed and normal weight. She is not ill-appearing.  HENT:     Head: Normocephalic and atraumatic.     Right Ear: Tympanic membrane normal.     Left Ear: Tympanic membrane normal.     Nose: Congestion and rhinorrhea present.     Comments: Facial sinuses are  tender    Mouth/Throat:     Pharynx: Posterior oropharyngeal erythema present.  Eyes:     Conjunctiva/sclera: Conjunctivae normal.     Pupils: Pupils are equal, round, and reactive to light.  Cardiovascular:     Rate and Rhythm: Normal rate and regular rhythm.     Heart sounds: Normal heart sounds.  Pulmonary:     Effort: Pulmonary effort is normal. No respiratory distress.     Breath sounds: Normal breath sounds.  Abdominal:     General: There is no distension.     Palpations: Abdomen is soft.  Musculoskeletal:        General: Normal range of motion.     Cervical back: Normal range of motion.  Lymphadenopathy:     Cervical: Cervical adenopathy present.  Skin:    General: Skin is warm and dry.  Neurological:     Mental Status: She is alert.      UC Treatments / Results  Labs (all labs ordered are listed, but only abnormal results are displayed) Labs Reviewed - No data to display  EKG   Radiology No results found.  Procedures Procedures (including critical care time)  Medications Ordered in UC Medications - No data to display  Initial Impression / Assessment and Plan / UC Course  I have reviewed the triage vital signs and the nursing notes.  Pertinent labs & imaging results that were available during my care of the patient were reviewed by me and considered in my medical decision making (see chart for details).     Patient's had symptoms lasting more than a week.  She feels like the symptoms are getting worse.  Will add an antibiotic.  Discussed symptomatic treatment Final Clinical Impressions(s) / UC Diagnoses   Final diagnoses:  Acute non-recurrent maxillary sinusitis     Discharge Instructions      Make sure you are drinking lots of water Consider humidifier in the bedroom Add Mucinex twice a day to loosen the mucus Take the antibiotic 2 times a day.  I have prescribed Augmentin.  This can sometimes cause stomach, if so take probiotics with it Take  prednisone 1 a day for 5 days to reduce the sinus congestion and  mucus.     ED Prescriptions     Medication Sig Dispense Auth. Provider   amoxicillin-clavulanate (AUGMENTIN) 875-125 MG tablet Take 1 tablet by mouth every 12 (twelve) hours. 14 tablet Eustace Moore, MD   predniSONE (DELTASONE) 50 MG tablet Take once a day for 5 days.  Take with food 5 tablet Eustace Moore, MD   guaiFENesin (MUCINEX) 600 MG 12 hr tablet Take 1 tablet (600 mg total) by mouth 2 (two) times daily. 30 tablet Eustace Moore, MD      PDMP not reviewed this encounter.   Eustace Moore, MD 09/25/23 [phone]

## 2023-09-25 NOTE — Discharge Instructions (Signed)
 Make sure you are drinking lots of water Consider humidifier in the bedroom Add Mucinex twice a day to loosen the mucus Take the antibiotic 2 times a day.  I have prescribed Augmentin.  This can sometimes cause stomach, if so take probiotics with it Take prednisone 1 a day for 5 days to reduce the sinus congestion and mucus.

## 2023-09-25 NOTE — ED Triage Notes (Signed)
 Patient c/o nasal and chest congestion x 1 week, cough, head congestion.  Afebrile.  Worse at night.  Patient has taken Nyquil and Advil Sinus Congestion.

## 2024-03-10 ENCOUNTER — Ambulatory Visit
Admission: RE | Admit: 2024-03-10 | Discharge: 2024-03-10 | Disposition: A | Discharge status: Home / Self Care | Attending: Family Medicine | Admitting: Family Medicine

## 2024-03-10 VITALS — BP 115/76 | HR 73 | Temp 98.4°F | Resp 17

## 2024-03-10 DIAGNOSIS — H9201 Otalgia, right ear: Secondary | ICD-10-CM | POA: Diagnosis not present

## 2024-03-10 MED ORDER — CIPROFLOXACIN-DEXAMETHASONE 0.3-0.1 % OT SUSP: 4.0000 [drp] | Freq: Two times a day (BID) | OTIC | 0 refills | Status: AC

## 2024-03-10 MED ORDER — FLUTICASONE PROPIONATE 50 MCG/ACT NA SUSP: 2.0000 | Freq: Every day | NASAL | 0 refills | Status: AC

## 2024-03-10 NOTE — ED Triage Notes (Signed)
 Pt c/o RT ear pain x 1 month. Tx with abx ear drops. Pain returned about a week ago. Sunday telehealth, rx'd amoxicillin  and no improvement since starting treatment.

## 2024-03-10 NOTE — Discharge Instructions (Signed)
 Make sure you are drinking lots of water Use the Flonase  daily until your symptoms have resolved Finish the weeks worth of antibiotic, 2 times a day with food Use the eardrop 2 times a day See your doctor if not improving by next week

## 2024-03-10 NOTE — ED Provider Notes (Signed)
 Brenda Jackson    CSN: [id number] Arrival date & time: 03/10/24  0954      History   Chief Complaint Chief Complaint  Patient presents with   Otalgia    RT    HPI Brenda Jackson is a 43 y.o. female.   Patient has had right ear pain off and on for over a month.  She initially was treated with some eardrops.  Currently is taking Augmentin .  She has gotten medication through telehealth.  No one has actually looked at her ear.  She states the [place]  She states that hearing is diminished in this ear.  She has not noticed any drainage.  She does notice pain if she tries to lay on her right side and expresses pain with manipulation of the outer ear.  She states that when she started in July had cough cold runny nose sinus symptoms, only resultant symptom is ear pressure pain and decreased hearing.  Right side only.    Past Medical History:  Diagnosis Date   Acid reflux    Anxiety    Depression 2007    Patient Active Problem List   Diagnosis Date Noted   IUD (intrauterine device) in place 01/29/2016   Pre-eclampsia 09/20/2013   Hyperprolactinemia (HCC) 03/05/2012   Vitamin D deficiency 08/21/2011    Past Surgical History:  Procedure Laterality Date   EYE SURGERY      OB History   No obstetric history on file.      Home Medications    Prior to Admission medications   Medication Sig Start Date End Date Taking? Authorizing Provider  amoxicillin -clavulanate (AUGMENTIN ) 875-125 MG tablet Take 1 tablet by mouth 2 (two) times daily.   Yes [provider]  ciprofloxacin -dexamethasone  (CIPRODEX ) OTIC suspension Place 4 drops into the right ear 2 (two) times daily. 03/10/24  Yes Maranda Jamee Jacob, MD  fluticasone  (FLONASE ) 50 MCG/ACT nasal spray Place 2 sprays into both nostrils daily. 03/10/24  Yes Maranda Jamee Jacob, MD  guaiFENesin  (MUCINEX ) 600 MG 12 hr tablet Take 1 tablet (600 mg total) by mouth 2 (two) times  daily. 09/25/23   Maranda Jamee Jacob, MD  levonorgestrel  (MIRENA ) 20 MCG/DAY IUD 1 each by Intrauterine route once.    [provider]  omeprazole  (PRILOSEC) 20 MG capsule Take 1 capsule (20 mg total) by mouth daily. 01/17/21   Maranda Jamee Jacob, MD    Family History Family History  Problem Relation Age of Onset   Anxiety disorder Mother    Depression Mother    Heart attack Maternal Grandmother    Arthritis Maternal Grandmother    Heart disease Maternal Grandmother    Varicose Veins Maternal Aunt     Social History Social History   Tobacco Use   Smoking status: Never   Smokeless tobacco: Never  Vaping Use   Vaping status: Never Used  Substance Use Topics   Alcohol use: Yes    Comment: Drink socially, maybe once per month   Drug use: Not Currently     Allergies   Patient has no known allergies.   Review of Systems Review of Systems See HPI  Physical Exam Triage Vital Signs ED Triage Vitals  Encounter Vitals Group     BP 03/10/24 1003 115/76     Girls Systolic BP Percentile --      Girls Diastolic BP Percentile --      Boys Systolic BP Percentile --  Boys Diastolic BP Percentile --      Pulse Rate 03/10/24 1003 73     Resp 03/10/24 1003 17     Temp 03/10/24 1003 98.4 F (36.9 C)     Temp Source 03/10/24 1003 Oral     SpO2 03/10/24 1003 99 %     Weight --      Height --      Head Circumference --      Peak Flow --      Pain Score 03/10/24 1023 2     Pain Loc --      Pain Education --      Exclude from Growth Chart --    No data found.  Updated Vital Signs BP 115/76 (BP Location: Right Arm)   Pulse 73   Temp 98.4 F (36.9 C) (Oral)   Resp 17   SpO2 99%      Physical Exam Constitutional:      General: She is not in acute distress.    Appearance: She is well-developed and normal weight. She is not ill-appearing.  HENT:     Head: Normocephalic and atraumatic.     Left Ear: Tympanic membrane and ear canal normal.     Ears:      Comments: Right TM dull.  Some yellow crusting on TM.  Erythema and periphery    Nose: Nose normal. No congestion.     Mouth/Throat:     Mouth: Mucous membranes are moist.     Pharynx: No posterior oropharyngeal erythema.  Eyes:     Conjunctiva/sclera: Conjunctivae normal.     Pupils: Pupils are equal, round, and reactive to light.  Cardiovascular:     Rate and Rhythm: Normal rate.  Pulmonary:     Effort: Pulmonary effort is normal. No respiratory distress.  Abdominal:     General: There is no distension.     Palpations: Abdomen is soft.  Musculoskeletal:        General: Normal range of motion.     Cervical back: Normal range of motion.  Skin:    General: Skin is warm and dry.  Neurological:     Mental Status: She is alert.      UC Treatments / Results  Labs (all labs ordered are listed, but only abnormal results are displayed) Labs Reviewed - No data to display  EKG   Radiology No results found.  Procedures Procedures (including critical Jackson time)  Medications Ordered in UC Medications - No data to display  Initial Impression / Assessment and Plan / UC Course  I have reviewed the triage vital signs and the nursing notes.  Pertinent labs & imaging results that were available during my Jackson of the patient were reviewed by me and considered in my medical decision making (see chart for details).     Difficult to tell, but it appears that she may have had a TM rupture that is healing.  In any event we will do eardrops, continue Augmentin , add Flonase .  See PCP if fails to improve Final Clinical Impressions(s) / UC Diagnoses   Final diagnoses:  Right ear pain     Discharge Instructions      Make sure you are drinking lots of water Use the Flonase  daily until your symptoms have resolved Finish the weeks worth of antibiotic, 2 times a day with food Use the eardrop 2 times a day See your doctor if not improving by next week   ED Prescriptions  Medication Sig Dispense Auth. Provider   ciprofloxacin -dexamethasone  (CIPRODEX ) OTIC suspension Place 4 drops into the right ear 2 (two) times daily. 7.5 mL Maranda Jamee Jacob, MD   fluticasone  (FLONASE ) 50 MCG/ACT nasal spray Place 2 sprays into both nostrils daily. 16 g Maranda Jamee Jacob, MD      PDMP not reviewed this encounter.   Maranda Jamee Jacob, MD 03/10/24 1037

## 2024-03-11 ENCOUNTER — Telehealth: Payer: Self-pay

## 2024-03-11 NOTE — Telephone Encounter (Signed)
 Called to check on patient. No needs.

## 2024-03-22 ENCOUNTER — Ambulatory Visit (INDEPENDENT_AMBULATORY_CARE_PROVIDER_SITE_OTHER)

## 2024-03-22 ENCOUNTER — Encounter: Payer: Self-pay | Admitting: Podiatry

## 2024-03-22 ENCOUNTER — Ambulatory Visit (INDEPENDENT_AMBULATORY_CARE_PROVIDER_SITE_OTHER): Admitting: Podiatry

## 2024-03-22 DIAGNOSIS — M722 Plantar fascial fibromatosis: Secondary | ICD-10-CM | POA: Diagnosis not present

## 2024-03-22 MED ORDER — TRIAMCINOLONE ACETONIDE 10 MG/ML IJ SUSP
2.5000 mg | Freq: Once | INTRAMUSCULAR | Status: AC
  Administered 2024-03-22: 2.5 mg via INTRA_ARTICULAR

## 2024-03-22 MED ORDER — DEXAMETHASONE SODIUM PHOSPHATE 120 MG/30ML IJ SOLN
4.0000 mg | Freq: Once | INTRAMUSCULAR | Status: AC
  Administered 2024-03-22: 4 mg via INTRA_ARTICULAR

## 2024-03-22 NOTE — Patient Instructions (Signed)

## 2024-03-22 NOTE — Progress Notes (Signed)
  Subjective:  Patient ID: Brenda Jackson, female    DOB: [birth year],   MRN: [id number]  Chief Complaint  Patient presents with   Foot Pain    I have heel pain on my left foot.    43 y.o. female presents for concern of this time left heel pain that has been ongoing for a while. Relates first steps are painful. Has been stretching. Relates on the right the things that helped were a couple injections and dry needling would like to try these . Denies any other pedal complaints. Denies n/v/f/c.   Past Medical History:  Diagnosis Date   Acid reflux    Anxiety    Depression 2007    Objective:  Physical Exam: Vascular: DP/PT pulses 2/4 bilateral. CFT <3 seconds. Normal hair growth on digits. No edema.  Skin. No lacerations or abrasions bilateral feet.  Musculoskeletal: MMT 5/5 bilateral lower extremities in DF, PF, Inversion and Eversion. Deceased ROM in DF of ankle joint. Tender to the medial calcaneal tubercle left . No pain with achilles, PT or arch. No pain with calcaneal squeeze.  Neurological: Sensation intact to light touch.   Assessment:   1. Plantar fasciitis, left      Plan:  Patient was evaluated and treated and all questions answered. Discussed plantar fasciitis with patient.  X-rays reviewed and discussed with patient. No acute fractures or dislocations noted. Mild spurring noted at inferior calcaneus.  Discussed treatment options including, ice, NSAIDS, supportive shoes, bracing, and stretching. Amb ref to PT.  Patient requesting injection today. Procedure note below.   Follow-up 6 weeks or sooner if any problems arise. In the meantime, encouraged to call the office with any questions, concerns, change in symptoms.   Procedure:  Discussed etiology, pathology, conservative vs. surgical therapies. At this time a plantar fascial injection was recommended.  The patient agreed and a sterile skin prep was applied.  An injection consisting of  1cc dexamethasone  0.5 cc  kenalog  and 1cc marcaine mixture was infiltrated at the point of maximal tenderness on the left Heel.  Bandaid applied. The patient tolerated this well and was given instructions for aftercare.    Asberry Failing, DPM

## 2024-05-03 ENCOUNTER — Ambulatory Visit: Admitting: Podiatry

## 2024-05-03 ENCOUNTER — Encounter: Payer: Self-pay | Admitting: Podiatry

## 2024-05-03 DIAGNOSIS — M722 Plantar fascial fibromatosis: Secondary | ICD-10-CM

## 2024-05-03 MED ORDER — TRIAMCINOLONE ACETONIDE 10 MG/ML IJ SUSP: 2.5000 mg | Freq: Once | INTRAMUSCULAR | Status: AC

## 2024-05-03 MED ORDER — DEXAMETHASONE SODIUM PHOSPHATE 120 MG/30ML IJ SOLN: 4.0000 mg | Freq: Once | INTRAMUSCULAR | Status: AC

## 2024-05-03 NOTE — Progress Notes (Signed)
  Subjective:  Patient ID: Brenda Jackson, female    DOB: [birth year],   MRN: [id number]  Chief Complaint  Patient presents with   Nail Problem    Rm23 Patient f/u plantar fasciitis left 40% improved/ pt is going well says she has some improvement/    43 y.o. female presents for follow-up of left plantar fasciitis.  Relates about 40% better.  Relates the injection did help.  She has been going to PT and doing dry needling.  She is still getting pain at the end of the day that is still causing her problem.. Denies any other pedal complaints. Denies n/v/f/c.   Past Medical History:  Diagnosis Date   Acid reflux    Anxiety    Depression 2007    Objective:  Physical Exam: Vascular: DP/PT pulses 2/4 bilateral. CFT <3 seconds. Normal hair growth on digits. No edema.  Skin. No lacerations or abrasions bilateral feet.  Musculoskeletal: MMT 5/5 bilateral lower extremities in DF, PF, Inversion and Eversion. Deceased ROM in DF of ankle joint. Tender to the medial calcaneal tubercle left . No pain with achilles, PT or arch. No pain with calcaneal squeeze.  Neurological: Sensation intact to light touch.   Assessment:   1. Plantar fasciitis, left       Plan:  Patient was evaluated and treated and all questions answered. Discussed plantar fasciitis with patient.  X-rays reviewed and discussed with patient. No acute fractures or dislocations noted. Mild spurring noted at inferior calcaneus.  Discussed treatment options including, ice, NSAIDS, supportive shoes, bracing, and stretching. Continue PT  Patient requesting injection today. Procedure note below.   Follow-up 6 weeks or sooner if any problems arise. In the meantime, encouraged to call the office with any questions, concerns, change in symptoms.   Procedure:  Discussed etiology, pathology, conservative vs. surgical therapies. At this time a plantar fascial injection was recommended.  The patient agreed and a sterile skin prep was  applied.  An injection consisting of  1cc dexamethasone  0.5 cc kenalog  and 1cc marcaine mixture was infiltrated at the point of maximal tenderness on the left Heel.  Bandaid applied. The patient tolerated this well and was given instructions for aftercare.    Asberry Failing, DPM

## 2024-06-14 ENCOUNTER — Ambulatory Visit: Admitting: Podiatry

## 2024-06-21 ENCOUNTER — Encounter: Payer: Self-pay | Admitting: Podiatry

## 2024-06-21 ENCOUNTER — Ambulatory Visit: Admitting: Podiatry

## 2024-06-21 DIAGNOSIS — M722 Plantar fascial fibromatosis: Secondary | ICD-10-CM | POA: Diagnosis not present

## 2024-06-21 MED ORDER — DEXAMETHASONE SODIUM PHOSPHATE 120 MG/30ML IJ SOLN
4.0000 mg | Freq: Once | INTRAMUSCULAR | Status: AC
  Administered 2024-06-21: 16:00:00 4 mg via INTRA_ARTICULAR

## 2024-06-21 MED ORDER — TRIAMCINOLONE ACETONIDE 10 MG/ML IJ SUSP
2.5000 mg | Freq: Once | INTRAMUSCULAR | Status: AC
  Administered 2024-06-21: 16:00:00 2.5 mg via INTRA_ARTICULAR

## 2024-06-21 NOTE — Progress Notes (Signed)
°  Subjective:  Patient ID: Brenda Jackson, female    DOB: 1981/05/27,   MRN: [id number]  Chief Complaint  Patient presents with   Plantar Fasciitis    It's better but it's not it's best.    43 y.o. female presents for follow-up of left plantar fasciitis.  Relates generally doing better but still has some ache.  She relates the pain is gone from more of a sharp pain to an ache around the heel.  She relates for steps are better but after walking for a while she still gets pain.  Denies any other pedal complaints. Denies n/v/f/c.   Past Medical History:  Diagnosis Date   Acid reflux    Anxiety    Depression 2007    Objective:  Physical Exam: Vascular: DP/PT pulses 2/4 bilateral. CFT <3 seconds. Normal hair growth on digits. No edema.  Skin. No lacerations or abrasions bilateral feet.  Musculoskeletal: MMT 5/5 bilateral lower extremities in DF, PF, Inversion and Eversion. Deceased ROM in DF of ankle joint.  Mildly tender to the medial calcaneal tubercle left . No pain with achilles, PT or arch. No pain with calcaneal squeeze.  Neurological: Sensation intact to light touch.   Assessment:   1. Plantar fasciitis, left        Plan:  Patient was evaluated and treated and all questions answered. Discussed plantar fasciitis with patient.  X-rays reviewed and discussed with patient. No acute fractures or dislocations noted. Mild spurring noted at inferior calcaneus.  Discussed treatment options including, ice, NSAIDS, supportive shoes, bracing, and stretching. Continue PT  Patient requesting injection today. Procedure note below.   Follow-up 6 weeks or sooner if any problems arise. In the meantime, encouraged to call the office with any questions, concerns, change in symptoms.   Procedure:  Discussed etiology, pathology, conservative vs. surgical therapies. At this time a plantar fascial injection was recommended.  The patient agreed and a sterile skin prep was applied.  An injection  consisting of  1cc dexamethasone  0.5 cc kenalog  and 1cc marcaine mixture was infiltrated at the point of maximal tenderness on the left Heel.  Bandaid applied. The patient tolerated this well and was given instructions for aftercare.    Asberry Failing, DPM
